# Patient Record
Sex: Female | Born: 2000 | Race: Black or African American | Hispanic: No | Marital: Single | Smoking: Never smoker
Health system: Southern US, Community
[De-identification: ages and names within clinical notes are randomized; demographics above are authoritative.]

---
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
## 2021-01-04 ENCOUNTER — Emergency Department (HOSPITAL_COMMUNITY): Payer: Medicaid Other

## 2021-01-04 ENCOUNTER — Encounter (HOSPITAL_COMMUNITY): Payer: Self-pay | Admitting: Emergency Medicine

## 2021-01-04 ENCOUNTER — Other Ambulatory Visit: Payer: Self-pay

## 2021-01-04 ENCOUNTER — Emergency Department (HOSPITAL_COMMUNITY)
Admission: EM | Admit: 2021-01-04 | Discharge: 2021-01-04 | Disposition: A | Discharge status: Home / Self Care | Payer: Medicaid Other | Attending: Emergency Medicine | Admitting: Emergency Medicine

## 2021-01-04 DIAGNOSIS — R112 Nausea with vomiting, unspecified: Secondary | ICD-10-CM | POA: Diagnosis not present

## 2021-01-04 DIAGNOSIS — R1031 Right lower quadrant pain: Secondary | ICD-10-CM | POA: Diagnosis not present

## 2021-01-04 LAB — COMPREHENSIVE METABOLIC PANEL
ALT: 21 U/L (ref 0–44)
AST: 28 U/L (ref 15–41)
Albumin: 3.9 g/dL (ref 3.5–5.0)
Alkaline Phosphatase: 75 U/L (ref 38–126)
Anion gap: 6 (ref 5–15)
BUN: 11 mg/dL (ref 6–20)
CO2: 26 mmol/L (ref 22–32)
Calcium: 8.9 mg/dL (ref 8.9–10.3)
Chloride: 107 mmol/L (ref 98–111)
Creatinine, Ser: 0.98 mg/dL (ref 0.44–1.00)
GFR, Estimated: >60 mL/min (ref >60)
Glucose, Bld: 117 mg/dL — ABNORMAL HIGH (ref 70–99)
Potassium: 3.7 mmol/L (ref 3.5–5.1)
Sodium: 139 mmol/L (ref 135–145)
Total Bilirubin: 0.9 mg/dL (ref 0.3–1.2)
Total Protein: 7.1 g/dL (ref 6.5–8.1)

## 2021-01-04 LAB — CBC WITH DIFFERENTIAL/PLATELET
Abs Immature Granulocytes: 0.01 10*3/uL (ref 0.00–0.07)
Basophils Absolute: 0.1 10*3/uL (ref 0.0–0.1)
Basophils Relative: 1 %
Eosinophils Absolute: 0.1 10*3/uL (ref 0.0–0.5)
Eosinophils Relative: 1 %
HCT: 37.8 % (ref 36.0–46.0)
Hemoglobin: 11.9 g/dL — ABNORMAL LOW (ref 12.0–15.0)
Immature Granulocytes: 0 %
Lymphocytes Relative: 27 %
Lymphs Abs: 1.6 10*3/uL (ref 0.7–4.0)
MCH: 27.8 pg (ref 26.0–34.0)
MCHC: 31.5 g/dL (ref 30.0–36.0)
MCV: 88.3 fL (ref 80.0–100.0)
Monocytes Absolute: 0.5 10*3/uL (ref 0.1–1.0)
Monocytes Relative: 8 %
Neutro Abs: 3.7 10*3/uL (ref 1.7–7.7)
Neutrophils Relative %: 63 %
Platelets: 385 10*3/uL (ref 150–400)
RBC: 4.28 MIL/uL (ref 3.87–5.11)
RDW: 15 % (ref 11.5–15.5)
WBC: 5.9 10*3/uL (ref 4.0–10.5)
nRBC: 0 % (ref 0.0–0.2)

## 2021-01-04 LAB — URINALYSIS, ROUTINE W REFLEX MICROSCOPIC
Bacteria, UA: NONE SEEN
Bilirubin Urine: NEGATIVE
Glucose, UA: NEGATIVE mg/dL
Ketones, ur: NEGATIVE mg/dL
Leukocytes,Ua: NEGATIVE
Nitrite: NEGATIVE
Protein, ur: NEGATIVE mg/dL
RBC / HPF: >50 RBC/hpf — ABNORMAL HIGH (ref 0–5)
Specific Gravity, Urine: 1.018 (ref 1.005–1.030)
pH: 5 (ref 5.0–8.0)

## 2021-01-04 LAB — PREGNANCY, URINE: Preg Test, Ur: NEGATIVE

## 2021-01-04 MED ORDER — NAPROXEN 500 MG PO TABS: 500.0000 mg | ORAL_TABLET | Freq: Two times a day (BID) | ORAL | 0 refills | Status: AC

## 2021-01-04 MED ORDER — IOHEXOL 300 MG/ML  SOLN
100.0000 mL | Freq: Once | INTRAMUSCULAR | Status: AC | PRN
  Administered 2021-01-04: 100 mL via INTRAVENOUS

## 2021-01-04 MED ORDER — NAPROXEN 500 MG PO TABS: 500.0000 mg | ORAL_TABLET | Freq: Two times a day (BID) | ORAL | 0 refills | Status: DC

## 2021-01-04 MED ORDER — SODIUM CHLORIDE 0.9 % IV BOLUS
1000.0000 mL | Freq: Once | INTRAVENOUS | Status: AC
  Administered 2021-01-04: 1000 mL via INTRAVENOUS

## 2021-01-04 MED ORDER — ONDANSETRON HCL 4 MG/2ML IJ SOLN
4.0000 mg | Freq: Once | INTRAMUSCULAR | Status: AC
  Administered 2021-01-04: 4 mg via INTRAVENOUS
  Filled 2021-01-04: qty 2

## 2021-01-04 MED ORDER — MORPHINE SULFATE (PF) 2 MG/ML IV SOLN
2.0000 mg | Freq: Once | INTRAVENOUS | Status: AC
  Administered 2021-01-04: 2 mg via INTRAVENOUS
  Filled 2021-01-04: qty 1

## 2021-01-04 NOTE — ED Provider Notes (Signed)
[facility] EMERGENCY DEPARTMENT Provider Note   CSN: [id number] Arrival date & time: 01/04/21  1950     History Chief Complaint  Patient presents with  . Nausea  . Emesis    Cynthia Lawson is a 20 y.o. female.  Patient presents chief complaint of nausea and vomiting to 3 episodes since last night.  Nonbloody nonbilious, also associate with abdominal pain in the right lower quadrant.  Describes as achy and radiating up to the right side.  No associated fevers no diarrhea noted.        History reviewed. No pertinent past medical history.  There are no problems to display for this patient.   History reviewed. No pertinent surgical history.   OB History   No obstetric history on file.     Family History  Problem Relation Age of Onset  . Diabetes Mother        Home Medications Prior to Admission medications   Medication Sig Start Date End Date Taking? Authorizing Provider  naproxen (NAPROSYN) 500 MG tablet Take 1 tablet (500 mg total) by mouth 2 (two) times daily with a meal for 5 days. 01/04/21 01/09/21 Yes Cheryll Cockayne, MD    Allergies    Patient has no known allergies.  Review of Systems   Review of Systems  Constitutional: Negative for fever.  HENT: Negative for ear pain.   Eyes: Negative for pain.  Respiratory: Negative for cough.   Cardiovascular: Negative for chest pain.  Gastrointestinal: Positive for abdominal pain.  Genitourinary: Negative for flank pain.  Musculoskeletal: Negative for back pain.  Skin: Negative for rash.  Neurological: Negative for headaches.    Physical Exam Updated Vital Signs BP 103/65 (BP Location: Right Arm)   Pulse 97   Temp 99.2 F (37.3 C)   Resp 18   Ht 4\' 1"  (1.245 m)   SpO2 100%   Physical Exam Constitutional:      General: She is not in acute distress.    Appearance: Normal appearance.  HENT:     Head: Normocephalic.     Nose: Nose normal.  Eyes:     Extraocular Movements:  Extraocular movements intact.  Cardiovascular:     Rate and Rhythm: Normal rate.  Pulmonary:     Effort: Pulmonary effort is normal.  Abdominal:     Tenderness: There is abdominal tenderness.     Comments: Some tenderness noted in the right lower quadrant region.  No guarding or rebound noted.  Musculoskeletal:        General: Normal range of motion.     Cervical back: Normal range of motion.  Neurological:     General: No focal deficit present.     Mental Status: She is alert. Mental status is at baseline.     ED Results / Procedures / Treatments   Labs (all labs ordered are listed, but only abnormal results are displayed) Labs Reviewed  COMPREHENSIVE METABOLIC PANEL - Abnormal; Notable for the following components:      Result Value   Glucose, Bld 117 (*)    All other components within normal limits  CBC WITH DIFFERENTIAL/PLATELET - Abnormal; Notable for the following components:   Hemoglobin 11.9 (*)    All other components within normal limits  URINALYSIS, ROUTINE W REFLEX MICROSCOPIC - Abnormal; Notable for the following components:   APPearance HAZY (*)    Hgb urine dipstick LARGE (*)    RBC / HPF >50 (*)    All other  components within normal limits  PREGNANCY, URINE    EKG None  Radiology CT Abdomen Pelvis W Contrast  Result Date: 01/04/2021 CLINICAL DATA:  Right lower quadrant pain. Kidney stone versus appendectomy. EXAM: CT ABDOMEN AND PELVIS WITH CONTRAST TECHNIQUE: Multidetector CT imaging of the abdomen and pelvis was performed using the standard protocol following bolus administration of intravenous contrast. CONTRAST:  OMNIPAQUE IOHEXOL 300 MG/ML  SOLN COMPARISON:  None. FINDINGS: Lower chest: Clear lung bases. Normal heart size without pericardial or pleural effusion. Hepatobiliary: Normal liver. Normal gallbladder, without biliary ductal dilatation. Pancreas: Normal, without mass or ductal dilatation. Spleen: Normal in size, without focal abnormality.  Adrenals/Urinary Tract: Normal adrenal glands. Normal kidneys, without hydronephrosis. Normal urinary bladder. Stomach/Bowel: Normal stomach, without wall thickening. Normal colon and terminal ileum. Normal appendix, including on coronal image 39. Normal small bowel. Vascular/Lymphatic: Normal caliber of the aorta and branch vessels. No abdominopelvic adenopathy. Reproductive: Normal uterus and adnexa. Other: No significant free fluid.  No free intraperitoneal air. Musculoskeletal: No acute osseous abnormality. IMPRESSION: No acute process or explanation for right lower quadrant pain. Normal appendix. Electronically Signed   By: Jeronimo Greaves M.D.   On: 01/04/2021 09:57    Procedures Procedures   Medications Ordered in ED Medications  morphine 2 MG/ML injection 2 mg (2 mg Intravenous Given 01/04/21 0914)  ondansetron (ZOFRAN) injection 4 mg (4 mg Intravenous Given 01/04/21 0915)  sodium chloride 0.9 % bolus 1,000 mL (1,000 mLs Intravenous New Bag/Given 01/04/21 0915)  iohexol (OMNIPAQUE) 300 MG/ML solution 100 mL (100 mLs Intravenous Contrast Given 01/04/21 0943)    ED Course  I have reviewed the triage vital signs and the nursing notes.  Pertinent labs & imaging results that were available during my care of the patient were reviewed by me and considered in my medical decision making (see chart for details).    MDM Rules/Calculators/A&P                          Labs show normal white count normal chemistry.  Urinalysis shows blood but no evidence of UTI.  CT of the pelvis pursued, no evidence of kidney stone noted.  Appendix is otherwise normal.  No clear etiology for the patient's pain found.  However vital signs remained stable evaluation is otherwise unremarkable for emergent pathology.  Will recommend close outpatient follow-up with her doctors within 3 to 4 days.  Recommend immediate return if she has fevers worsening pain or any additional concerns.  Final Clinical Impression(s) / ED  Diagnoses Final diagnoses:  Right lower quadrant abdominal pain    Rx / DC Orders ED Discharge Orders         Ordered    naproxen (NAPROSYN) 500 MG tablet  2 times daily with meals        01/04/21 1120           Cheryll Cockayne, MD 01/04/21 1120

## 2021-01-04 NOTE — Discharge Instructions (Addendum)
Call your primary care doctor or specialist as discussed in the next 2-3 days.   Return immediately back to the ER if:  Your symptoms worsen within the next 12-24 hours. You develop new symptoms such as new fevers, persistent vomiting, new pain, shortness of breath, or new weakness or numbness, or if you have any other concerns.  

## 2021-01-04 NOTE — ED Triage Notes (Signed)
Patient coming from home, complaint of n/v since yesterday. NAD. VSS.

## 2021-01-04 NOTE — ED Notes (Signed)
ED Provider at bedside. 

## 2022-01-18 IMAGING — CT CT ABD-PELV W/ CM
2 of 4 series · 16 of 46 positions shown, 18 images · IV contrast (Omni 300)
Comparison: None.

CLINICAL DATA: Right lower quadrant pain. Kidney stone versus
appendectomy.

EXAM:
CT ABDOMEN AND PELVIS WITH CONTRAST
TECHNIQUE: Multidetector CT imaging of the abdomen and pelvis was performed
using the standard protocol following bolus administration of
intravenous contrast.
CONTRAST:  100mL OMNIPAQUE IOHEXOL 300 MG/ML  SOLN

[Series 3: a/p w/ 5mm · axial · 0.68mm/px · z∈[+944,+1338]mm · 13 of 87 slices shown, 15 images]
[im 4/87  soft-tissue]
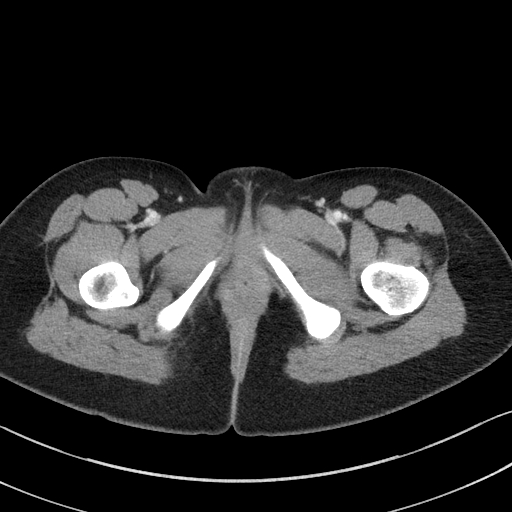
[im 4/87  bone]
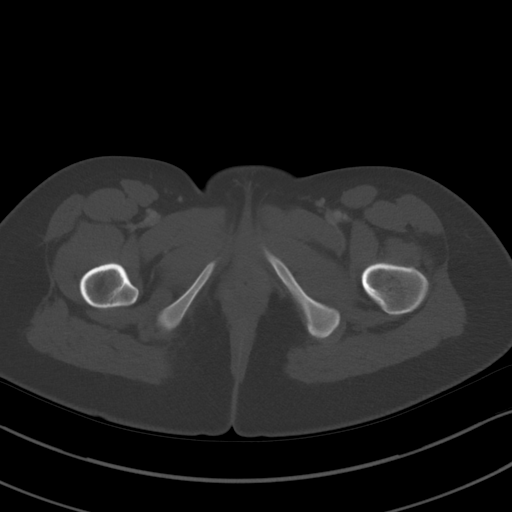
[im 10/87  soft-tissue]
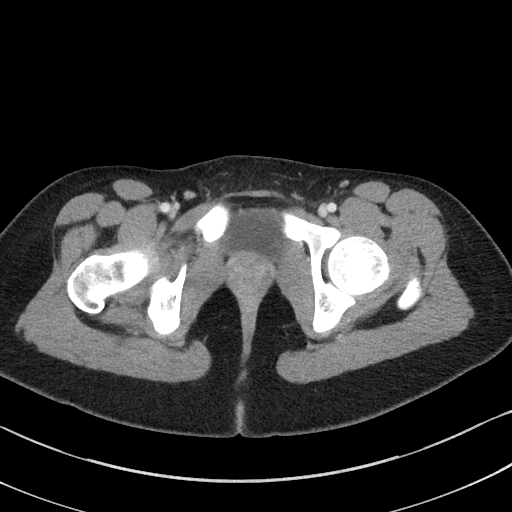
[im 17/87  soft-tissue]
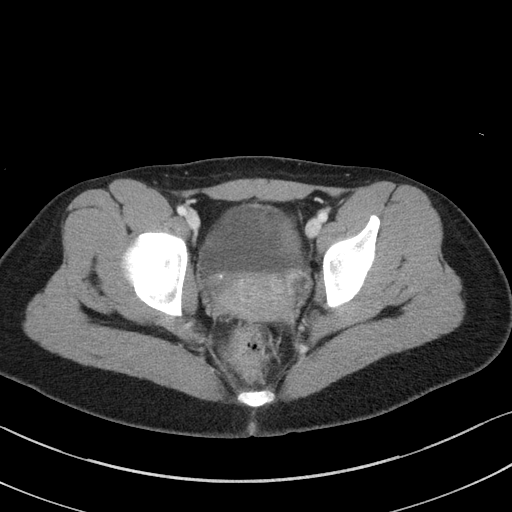
[im 24/87  soft-tissue]
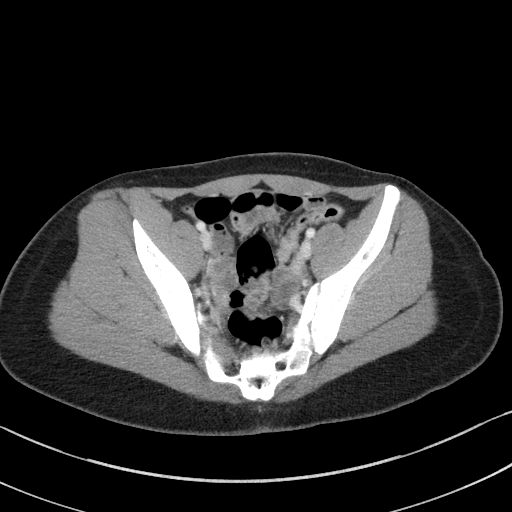
[im 30/87  soft-tissue]
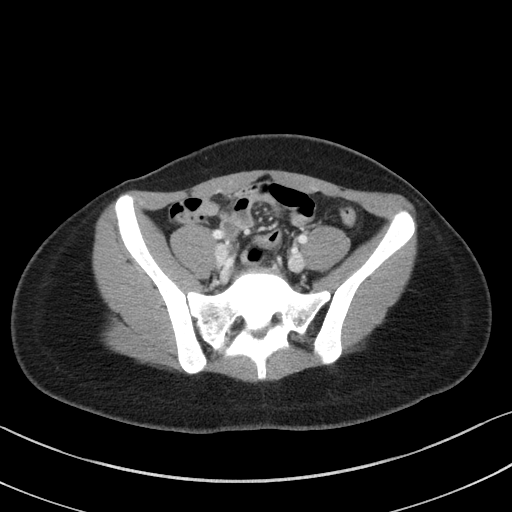
[im 37/87  soft-tissue]
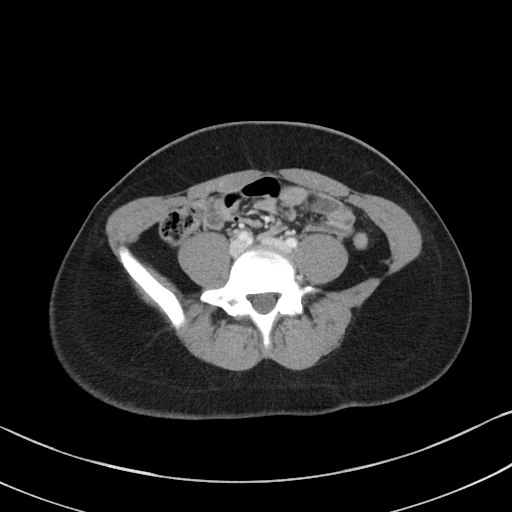
[im 44/87  soft-tissue]
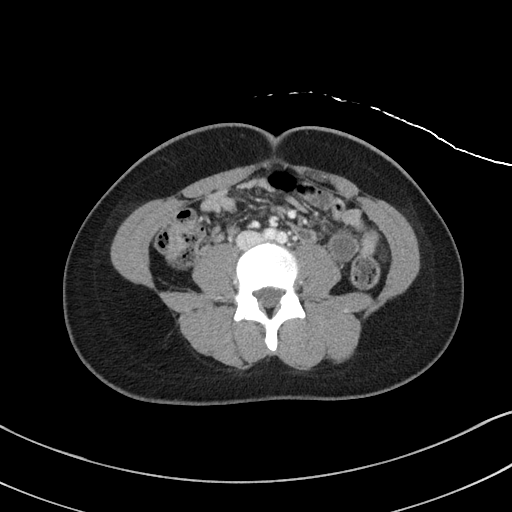
[im 50/87  soft-tissue]
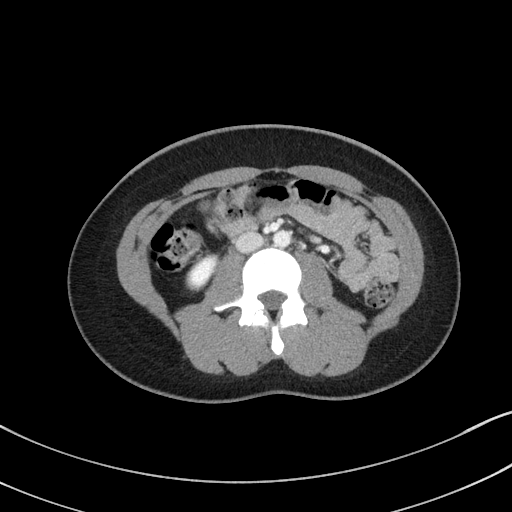
[im 57/87  soft-tissue]
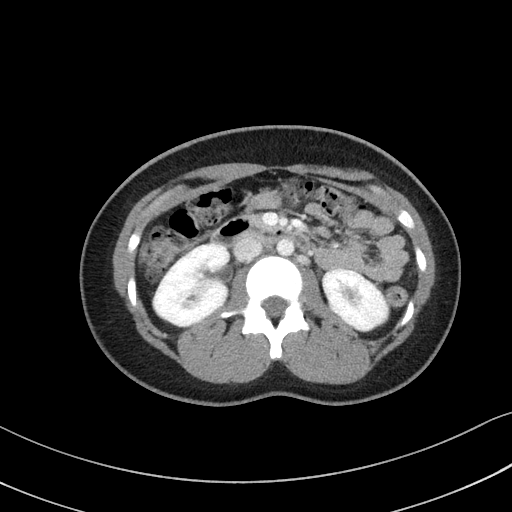
[im 57/87  bone]
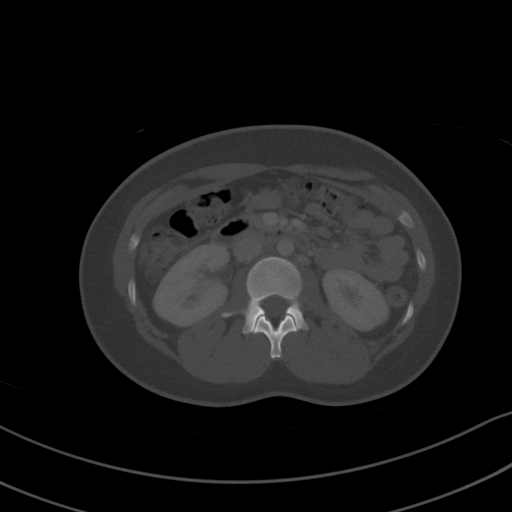
[im 63/87  soft-tissue]
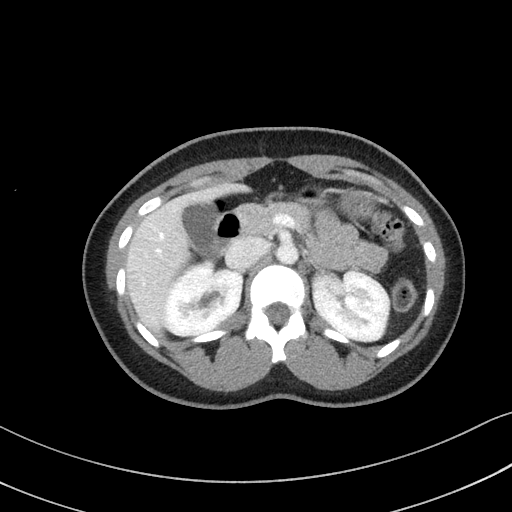
[im 70/87  soft-tissue]
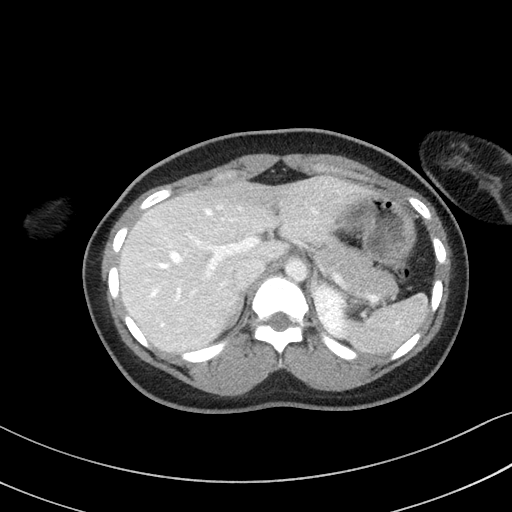
[im 77/87  soft-tissue]
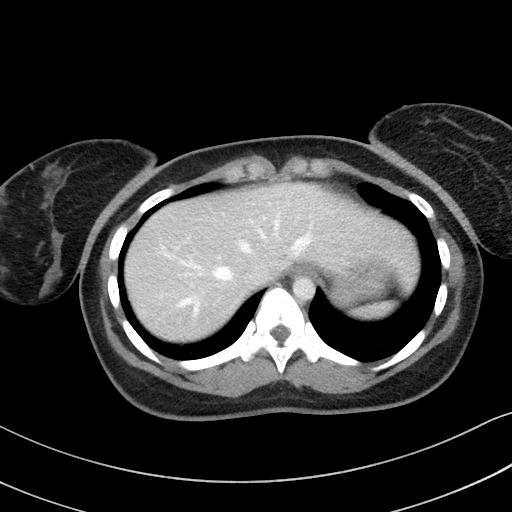
[im 83/87  soft-tissue]
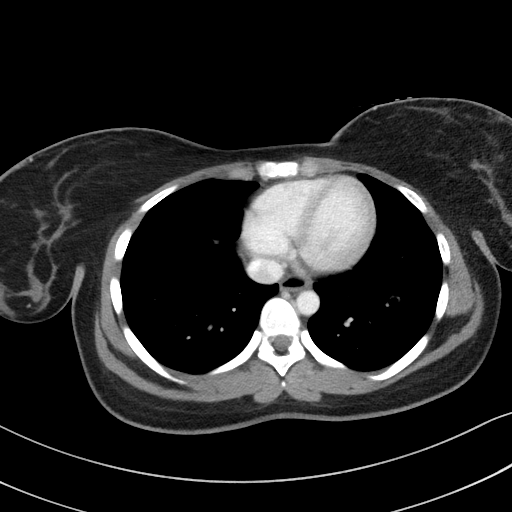

[Series 6: a/p w/ cor · coronal · 0.69mm/px · 3 of 111 slices shown]
[im 37/111  soft-tissue]
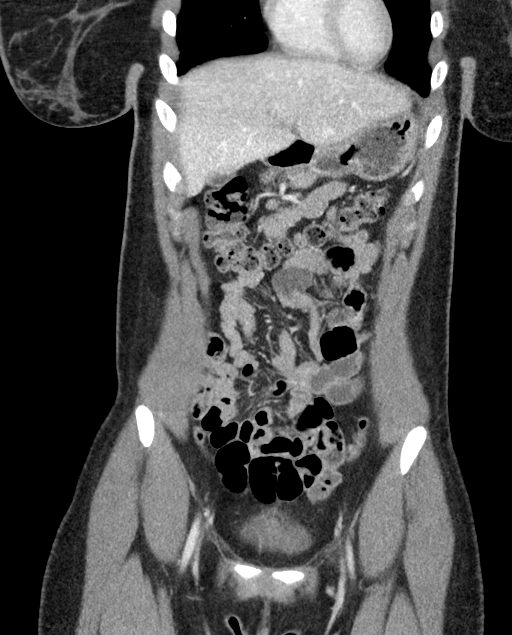
[im 49/111  soft-tissue]
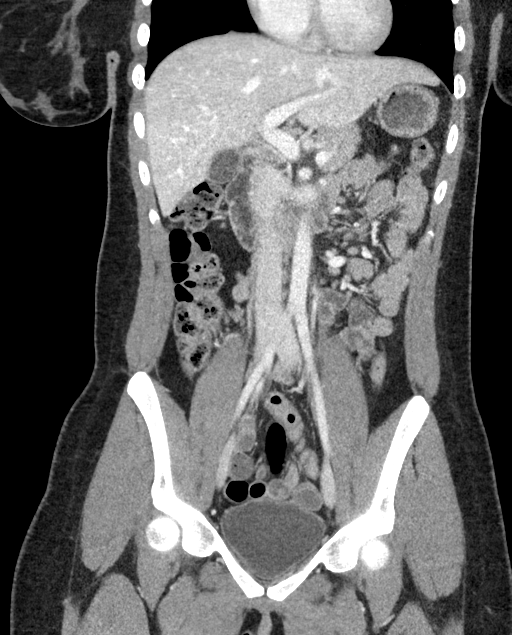
[im 62/111  soft-tissue]
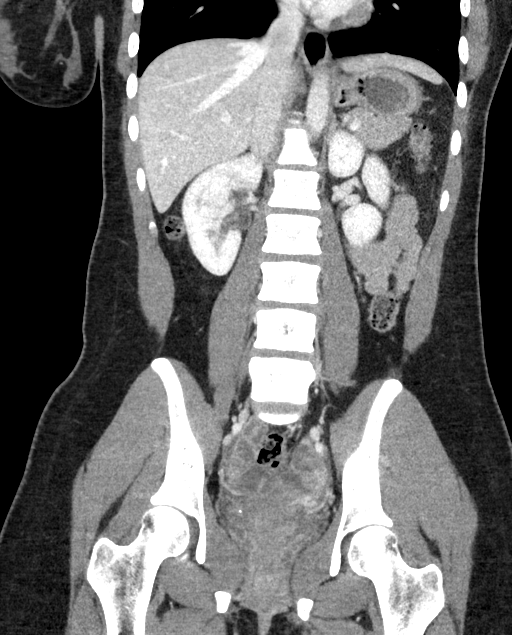

[16 of 46 positions shown; findings below may reference images not displayed]

FINDINGS: Lower chest: Clear lung bases. Normal heart size without pericardial
or pleural effusion.

Hepatobiliary: Normal liver. Normal gallbladder, without biliary
ductal dilatation.

Pancreas: Normal, without mass or ductal dilatation.

Spleen: Normal in size, without focal abnormality.

Adrenals/Urinary Tract: Normal adrenal glands. Normal kidneys,
without hydronephrosis. Normal urinary bladder.

Stomach/Bowel: Normal stomach, without wall thickening. Normal colon
and terminal ileum. Normal appendix, including on coronal image 39.
Normal small bowel.

Vascular/Lymphatic: Normal caliber of the aorta and branch vessels.
No abdominopelvic adenopathy.

Reproductive: Normal uterus and adnexa.

Other: No significant free fluid.  No free intraperitoneal air.

Musculoskeletal: No acute osseous abnormality.
IMPRESSION: No acute process or explanation for right lower quadrant pain.
Normal appendix.

## 2024-06-29 ENCOUNTER — Ambulatory Visit (HOSPITAL_COMMUNITY)
Admission: EM | Admit: 2024-06-29 | Discharge: 2024-06-29 | Disposition: A | Discharge status: Other Acute Inpatient Hospital

## 2024-06-29 ENCOUNTER — Other Ambulatory Visit: Payer: Self-pay

## 2024-06-29 ENCOUNTER — Encounter (HOSPITAL_COMMUNITY): Payer: Self-pay | Admitting: *Deleted

## 2024-06-29 ENCOUNTER — Emergency Department (HOSPITAL_COMMUNITY)

## 2024-06-29 ENCOUNTER — Emergency Department (HOSPITAL_COMMUNITY)
Admission: EM | Admit: 2024-06-29 | Discharge: 2024-06-29 | Disposition: A | Discharge status: Home / Self Care | Attending: Emergency Medicine | Admitting: Emergency Medicine

## 2024-06-29 DIAGNOSIS — Y9301 Activity, walking, marching and hiking: Secondary | ICD-10-CM | POA: Insufficient documentation

## 2024-06-29 DIAGNOSIS — Y92410 Unspecified street and highway as the place of occurrence of the external cause: Secondary | ICD-10-CM | POA: Diagnosis not present

## 2024-06-29 DIAGNOSIS — M79671 Pain in right foot: Secondary | ICD-10-CM | POA: Diagnosis present
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
## 2024-06-29 NOTE — Discharge Instructions (Addendum)
 It was a pleasure taking care of you today.  Based on your history, physical exam, and imaging I feel you are safe for discharge.  Today the x-ray of your right foot was negative for acute fracture or injury.  Most likely at this time your injury is a soft tissue injury.  Because of this I recommend rest, ice, compression, as well as elevation to help with swelling.  You may continue to take over-the-counter Tylenol and ibuprofen to help with pain.  Please keep in mind that the daily limit of Tylenol is 4000 mg/day and the max daily dose of ibuprofen is 1200 mg/day.  If you experience any of the following symptoms including but not limited to fever, chills, severe pain, inability to walk, or other concerning symptom please return to the emergency department. Otherwise recommend follow-up with primary care as scheduled, sooner if symptoms warrant.
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
